# Patient Record
Sex: Female | Born: 2000
Health system: Southern US, Community
[De-identification: ages and names within clinical notes are randomized; demographics above are authoritative.]

## PROBLEM LIST (undated history)

## (undated) DIAGNOSIS — M419 Scoliosis, unspecified: Secondary | ICD-10-CM

## (undated) DIAGNOSIS — Q8500 Neurofibromatosis, unspecified: Secondary | ICD-10-CM

## (undated) HISTORY — DX: Neurofibromatosis, unspecified: Q85.00

## (undated) HISTORY — PX: BACK SURGERY: SHX140

---
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
## 2018-08-25 DIAGNOSIS — Z03818 Encounter for observation for suspected exposure to other biological agents ruled out: Secondary | ICD-10-CM | POA: Diagnosis not present

## 2018-09-01 ENCOUNTER — Telehealth: Payer: Self-pay | Admitting: Cardiology

## 2018-09-01 NOTE — Telephone Encounter (Signed)
Virtual Visit Pre-Appointment Phone Call  "(Name), I am calling you today to discuss your upcoming appointment. We are currently trying to limit exposure to the virus that causes COVID-19 by seeing patients at home rather than in the office."  1. "What is the BEST phone number to call the day of the visit?" - include this in appointment notes  2. Do you have or have access to (through a family member/friend) a smartphone with video capability that we can use for your visit?" a. If yes - list this number in appt notes as cell (if different from BEST phone #) and list the appointment type as a VIDEO visit in appointment notes b. If no - list the appointment type as a PHONE visit in appointment notes  3. Confirm consent - "In the setting of the current Covid19 crisis, you are scheduled for a (phone or video) visit with your provider on (date) at (time).  Just as we do with many in-office visits, in order for you to participate in this visit, we must obtain consent.  If you'd like, I can send this to your mychart (if signed up) or email for you to review.  Otherwise, I can obtain your verbal consent now.  All virtual visits are billed to your insurance company just like a normal visit would be.  By agreeing to a virtual visit, we'd like you to understand that the technology does not allow for your provider to perform an examination, and thus may limit your provider's ability to fully assess your condition. If your provider identifies any concerns that need to be evaluated in person, we will make arrangements to do so.  Finally, though the technology is pretty good, we cannot assure that it will always work on either your or our end, and in the setting of a video visit, we may have to convert it to a phone-only visit.  In either situation, we cannot ensure that we have a secure connection.  Are you willing to proceed?" STAFF: Did the patient verbally acknowledge consent to telehealth visit? Document  YES/NO here: Yes  4. Advise patient to be prepared - "Two hours prior to your appointment, go ahead and check your blood pressure, pulse, oxygen saturation, and your weight (if you have the equipment to check those) and write them all down. When your visit starts, your provider will ask you for this information. If you have an Apple Watch or Kardia device, please plan to have heart rate information ready on the day of your appointment. Please have a pen and paper handy nearby the day of the visit as well."  5. Give patient instructions for MyChart download to smartphone OR Doximity/Doxy.me as below if video visit (depending on what platform provider is using)  6. Inform patient they will receive a phone call 15 minutes prior to their appointment time (may be from unknown caller ID) so they should be prepared to answer    TELEPHONE CALL NOTE  Toni Caldwell has been deemed a candidate for a follow-up tele-health visit to limit community exposure during the Covid-19 pandemic. I spoke with the patient via phone to ensure availability of phone/video source, confirm preferred email & phone number, and discuss instructions and expectations.  I reminded Toni Caldwell to be prepared with any vital sign and/or heart rhythm information that could potentially be obtained via home monitoring, at the time of her visit. I reminded Toni Caldwell to expect a phone call prior to  her visit.  Calla Kicks 09/01/2018 4:13 PM   INSTRUCTIONS FOR DOWNLOADING THE MYCHART APP TO SMARTPHONE  - The patient must first make sure to have activated MyChart and know their login information - If Apple, go to [employer] and type in MyChart in the search bar and download the app. If Android, ask patient to go to [employer] and type in [place] in the search bar and download the app. The app is free but as with any other app downloads, their phone may require them to verify saved payment information or  Apple/Android password.  - The patient will need to then log into the app with their MyChart username and password, and select [facility] as their healthcare provider to link the account. When it is time for your visit, go to the MyChart app, find appointments, and click Begin Video Visit. Be sure to Select Allow for your device to access the Microphone and Camera for your visit. You will then be connected, and your provider will be with you shortly.  **If they have any issues connecting, or need assistance please contact MyChart service desk [phone])**  **If using a computer, in order to ensure the best quality for their visit they will need to use either of the following Internet Browsers: [employer], or Google Chrome**  IF USING DOXIMITY or DOXY.ME - The patient will receive a link just prior to their visit by text.     FULL LENGTH CONSENT FOR TELE-HEALTH VISIT   I hereby voluntarily request, consent and authorize [facility] and its employed or contracted physicians, physician assistants, nurse practitioners or other licensed health care professionals (the Practitioner), to provide me with telemedicine health care services (the Services") as deemed necessary by the treating Practitioner. I acknowledge and consent to receive the Services by the Practitioner via telemedicine. I understand that the telemedicine visit will involve communicating with the Practitioner through live audiovisual communication technology and the disclosure of certain medical information by electronic transmission. I acknowledge that I have been given the opportunity to request an in-person assessment or other available alternative prior to the telemedicine visit and am voluntarily participating in the telemedicine visit.  I understand that I have the right to withhold or withdraw my consent to the use of telemedicine in the course of my care at any time, without affecting my right to future care  or treatment, and that the Practitioner or I may terminate the telemedicine visit at any time. I understand that I have the right to inspect all information obtained and/or recorded in the course of the telemedicine visit and may receive copies of available information for a reasonable fee.  I understand that some of the potential risks of receiving the Services via telemedicine include:   Delay or interruption in medical evaluation due to technological equipment failure or disruption;  Information transmitted may not be sufficient (e.g. poor resolution of images) to allow for appropriate medical decision making by the Practitioner; and/or   In rare instances, security protocols could fail, causing a breach of personal health information.  Furthermore, I acknowledge that it is my responsibility to provide information about my medical history, conditions and care that is complete and accurate to the best of my ability. I acknowledge that Practitioner's advice, recommendations, and/or decision may be based on factors not within their control, such as incomplete or inaccurate data provided by me or distortions of diagnostic images or specimens that may result from electronic transmissions. I  understand that the practice of medicine is not an exact science and that Practitioner makes no warranties or guarantees regarding treatment outcomes. I acknowledge that I will receive a copy of this consent concurrently upon execution via email to the email address I last provided but may also request a printed copy by calling the office of [facility].    I understand that my insurance will be billed for this visit.   I have read or had this consent read to me.  I understand the contents of this consent, which adequately explains the benefits and risks of the Services being provided via telemedicine.   I have been provided ample opportunity to ask questions regarding this consent and the Services and have had  my questions answered to my satisfaction.  I give my informed consent for the services to be provided through the use of telemedicine in my medical care  By participating in this telemedicine visit I agree to the above.

## 2018-09-07 ENCOUNTER — Other Ambulatory Visit: Payer: Self-pay

## 2018-09-07 ENCOUNTER — Encounter: Payer: Self-pay | Admitting: Cardiology

## 2018-09-07 ENCOUNTER — Telehealth (INDEPENDENT_AMBULATORY_CARE_PROVIDER_SITE_OTHER): Payer: 59 | Admitting: Cardiology

## 2018-09-07 VITALS — BP 104/64 | HR 98 | Temp 98.4°F | Ht 60.0 in | Wt 112.0 lb

## 2018-09-07 DIAGNOSIS — Q231 Congenital insufficiency of aortic valve: Secondary | ICD-10-CM | POA: Diagnosis not present

## 2018-09-07 DIAGNOSIS — R002 Palpitations: Secondary | ICD-10-CM | POA: Insufficient documentation

## 2018-09-07 DIAGNOSIS — I341 Nonrheumatic mitral (valve) prolapse: Secondary | ICD-10-CM

## 2018-09-07 DIAGNOSIS — R011 Cardiac murmur, unspecified: Secondary | ICD-10-CM | POA: Insufficient documentation

## 2018-09-07 NOTE — Progress Notes (Signed)
Virtual Visit via Video Note   This visit type was conducted due to national recommendations for restrictions regarding the COVID-19 Pandemic (e.g. social distancing) in an effort to limit this patient's exposure and mitigate transmission in our community.  Due to her co-morbid illnesses, this patient is at least at moderate risk for complications without adequate follow up.  This format is felt to be most appropriate for this patient at this time.  All issues noted in this document were discussed and addressed.  A limited physical exam was performed with this format.  Please refer to the patient's chart for her consent to telehealth for [facility].   Date:  09/07/2018   ID:  Toni Caldwell, DOB 2000-11-21, MRN [id number]  Patient Location: Home Provider Location: Office  PCP:  Rochel Brome, MD  Cardiologist:  Shirlee More, MD  Electrophysiologist:  None   Evaluation Performed:  Follow-Up Visit  Chief Complaint:  Annual follow up of biscuspid aortic valve, mild mitral valve prolapse, and palpitations.   History of Present Illness:    Toni Caldwell is a 18 y.o. female with bicuspid aortic valve, mild mitral valve prolapse with mild to moderate regurgitation on echo 09/19/17 associated with murmur, palpitations, and scoliosis. In May 2019 she had workup for palpitations and tachycardia with 48 hour Holter showing SR with rare PAC's/PVC's and sinus arrhythmia. Due to her abnormal echocardiogram she was prescribed prophylactic antibiotics for dental procedures.   The patient does not have symptoms concerning for COVID-19 infection (fever, chills, cough, or new shortness of breath).   Overall she has done well but still has intermittent episodes where her heart seems rapid and lightheaded.  The rhythms are not sustained severe we discussed the potential of the iPhone adapter to record heart rhythm but do not feel is necessary at this time I did ask her to be sure she drinks adequate  liquids and to consider adding salt to her diet.  She follows endocarditis prophylaxis because of her spine surgery has had no chest pain shortness of breath or syncope.  We discussed that I would likely wait at least 1 more year repeat before repeating an echocardiogram and she has no restrictions to activities and will continue endocarditis prophylaxis Past Medical History:  Diagnosis Date  . Scoliosis    Past Surgical History:  Procedure Laterality Date  . BACK SURGERY       No outpatient medications have been marked as taking for the 09/07/18 encounter (Appointment) with Richardo Priest, MD.     Allergies:   Codeine   Social History   Tobacco Use  . Smoking status: Never Smoker  . Smokeless tobacco: Never Used  Substance Use Topics  . Alcohol use: No  . Drug use: No     Family Hx: The patient's family history includes Healthy in her mother; Other in her father.  ROS:   Please see the history of present illness.     All other systems reviewed and are negative.   Prior CV studies:   The following studies were reviewed today:  Echo performed 09/19/2017: EF 60-65%, bicuspid AV, mild MV prolapse, mild to moderate MV  Regurgitation.   48h Holter monitor 06/2017: "predominantly sinus rhythm with sinus arrhhythmia and race PAC's/PVC's. No significant arrhythmia"  Labs/Other Tests and Data Reviewed:    EKG:  No ECG reviewed.  Recent Labs: No results found for requested labs within last 8760 hours.   Recent Lipid Panel No results found for: CHOL,  TRIG, HDL, CHOLHDL, LDLCALC, LDLDIRECT  Wt Readings from Last 3 Encounters:  09/30/17 111 lb (50.3 kg) (18 %, Z= -0.93)*  08/30/17 111 lb (50.3 kg) (18 %, Z= -0.92)*  07/19/17 111 lb 3.2 oz (50.4 kg) (19 %, Z= -0.89)*   * Growth percentiles are based on CDC (Girls, 2-20 Years) data.     Objective:    Vital Signs:  LMP 09/27/2017    VITAL SIGNS:  reviewed GEN:  no acute distress EYES:  sclerae anicteric, EOMI -  Extraocular Movements Intact RESPIRATORY:  normal respiratory effort, symmetric expansion CARDIOVASCULAR:  no peripheral edema SKIN:  no rash, lesions or ulcers. MUSCULOSKELETAL:  no obvious deformities. NEURO:  alert and oriented x 3, no obvious focal deficit PSYCH:  normal affect  ASSESSMENT & PLAN:    1. Bicuspid aortic valve -stable recheck echocardiogram 1 year and continue endocarditis prophylaxis 2. Mitral valve prolapse with mild to moderate regurgitations -recheck echocardiogram 1 year 3. Palpitations -persistent symptoms in the past correlated with sinus arrhythmia I do not think we need to repeat a arrhythmia evaluation at this time and if worsened would benefit from recording heart rhythm with the iPhone adapter.  COVID-19 Education: The signs and symptoms of COVID-19 were discussed with the patient and how to seek care for testing (follow up with PCP or arrange E-visit).  The importance of social distancing was discussed today.  Time:   Today, I have spent 15 minutes with the patient with telehealth technology discussing the above problems.     Medication Adjustments/Labs and Tests Ordered: Current medicines are reviewed at length with the patient today.  Concerns regarding medicines are outlined above.   Tests Ordered: No orders of the defined types were placed in this encounter.   Medication Changes: No orders of the defined types were placed in this encounter.   Disposition:  Follow up 1 year  Signed, Shirlee More, MD  09/07/2018 9:30 AM    [place]

## 2018-09-07 NOTE — Patient Instructions (Signed)
Medication Instructions:  Your physician recommends that you continue on your current medications as directed. Please refer to the Current Medication list given to you today.   If you need a refill on your cardiac medications before your next appointment, please call your pharmacy.   Lab work: None If you have labs (blood work) drawn today and your tests are completely normal, you will receive your results only by: [facility] MyChart Message (if you have MyChart) OR . A paper copy in the mail If you have any lab test that is abnormal or we need to change your treatment, we will call you to review the results.  Testing/Procedures: Your physician has requested that you have an echocardiogram. Echocardiography is a painless test that uses sound waves to create images of your heart. It provides your doctor with information about the size and shape of your heart and how well your heart's chambers and valves are working. This procedure takes approximately one hour. There are no restrictions for this procedure.  Your echo will be scheduled closer to your  1 year appointment.  Follow-Up: At [facility], you and your health needs are our priority.  As part of our continuing mission to provide you with exceptional heart care, we have created designated Provider Care Teams.  These Care Teams include your primary Cardiologist (physician) and Advanced Practice Providers (APPs -  Physician Assistants and Nurse Practitioners) who all work together to provide you with the care you need, when you need it. You will need a follow up appointment in 1 years.  Please call our office 2 months in advance to schedule this appointment.   Any Other Special Instructions Will Be Listed Below (If Applicable).   Echocardiogram An echocardiogram is a procedure that uses painless sound waves (ultrasound) to produce an image of the heart. Images from an echocardiogram can provide important information about:  Signs of coronary  artery disease (CAD).  Aneurysm detection. An aneurysm is a weak or damaged part of an artery wall that bulges out from the normal force of blood pumping through the body.  Heart size and shape. Changes in the size or shape of the heart can be associated with certain conditions, including heart failure, aneurysm, and CAD.  Heart muscle function.  Heart valve function.  Signs of a past heart attack.  Fluid buildup around the heart.  Thickening of the heart muscle.  A tumor or infectious growth around the heart valves. Tell a health care provider about:  Any allergies you have.  All medicines you are taking, including vitamins, herbs, eye drops, creams, and over-the-counter medicines.  Any blood disorders you have.  Any surgeries you have had.  Any medical conditions you have.  Whether you are pregnant or may be pregnant. What are the risks? Generally, this is a safe procedure. However, problems may occur, including:  Allergic reaction to dye (contrast) that may be used during the procedure. What happens before the procedure? No specific preparation is needed. You may eat and drink normally. What happens during the procedure?   An IV tube may be inserted into one of your veins.  You may receive contrast through this tube. A contrast is an injection that improves the quality of the pictures from your heart.  A gel will be applied to your chest.  A wand-like tool (transducer) will be moved over your chest. The gel will help to transmit the sound waves from the transducer.  The sound waves will harmlessly bounce off of  your heart to allow the heart images to be captured in real-time motion. The images will be recorded on a computer. The procedure may vary among health care providers and hospitals. What happens after the procedure?  You may return to your normal, everyday life, including diet, activities, and medicines, unless your health care provider tells you not to do  that. Summary  An echocardiogram is a procedure that uses painless sound waves (ultrasound) to produce an image of the heart.  Images from an echocardiogram can provide important information about the size and shape of your heart, heart muscle function, heart valve function, and fluid buildup around your heart.  You do not need to do anything to prepare before this procedure. You may eat and drink normally.  After the echocardiogram is completed, you may return to your normal, everyday life, unless your health care provider tells you not to do that. This information is not intended to replace advice given to you by your health care provider. Make sure you discuss any questions you have with your health care provider. Document Released: 04/02/2000 Document Revised: 05/08/2016 Document Reviewed: 05/08/2016 Elsevier Interactive Patient Education  2019 [employer].

## 2019-03-23 ENCOUNTER — Telehealth: Payer: Self-pay | Admitting: Cardiology

## 2019-03-23 ENCOUNTER — Other Ambulatory Visit: Payer: Self-pay | Admitting: Cardiology

## 2019-03-23 DIAGNOSIS — Q231 Congenital insufficiency of aortic valve: Secondary | ICD-10-CM

## 2019-03-23 MED ORDER — AMOXICILLIN 500 MG PO CAPS: 2000.0000 mg | ORAL_CAPSULE | Freq: Every day | ORAL | 1 refills | Status: DC | PRN

## 2019-03-23 MED FILL — AMOXICILLIN 500 MG CAPSULE: 500 | 5 days supply | Qty: 20 | Fill #0

## 2019-03-23 NOTE — Telephone Encounter (Signed)
Please advise if amoxicillin is needed prior to dental procedures and if so what dosage should be sent in for patient. Thanks!

## 2019-03-23 NOTE — Telephone Encounter (Signed)
Yes amoxicillin 2 g before dental I will send the prescription

## 2019-03-23 NOTE — Telephone Encounter (Signed)
Patient has upcomg dental appt next week and needs her antibiotic called to the [facility] on [address] in [place].

## 2019-07-10 IMAGING — CT CT CHEST W/O CM
2 of 4 series · 15 of 36 positions shown, 18 images · non-contrast
Comparison: None.

CLINICAL DATA: Abnormal echocardiogram.  Evaluate aortic valve.

EXAM:
CT CHEST WITHOUT CONTRAST
TECHNIQUE: Multidetector CT imaging of the chest was performed following the
standard protocol without IV contrast.

[Series 2: thorax · axial · 0.64mm/px · z∈[-278,-16]mm · 12 of 147 slices shown, 15 images]
[im 8/147  mediastinal]
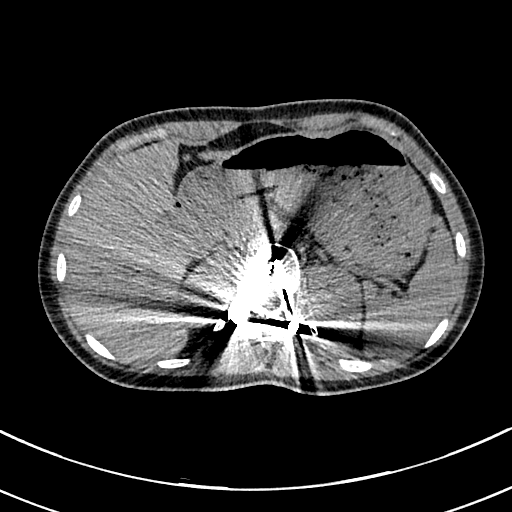
[im 8/147  lung]
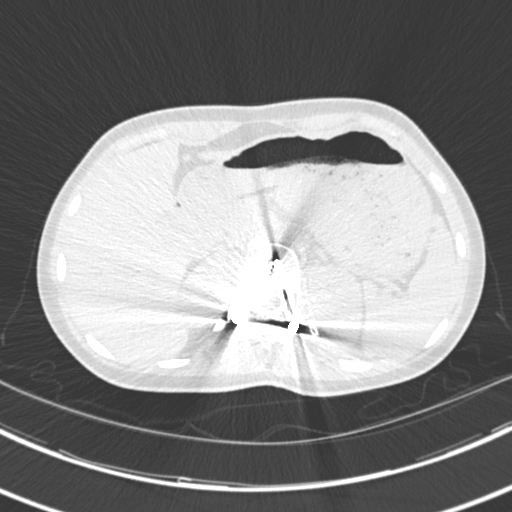
[im 22/147  lung]
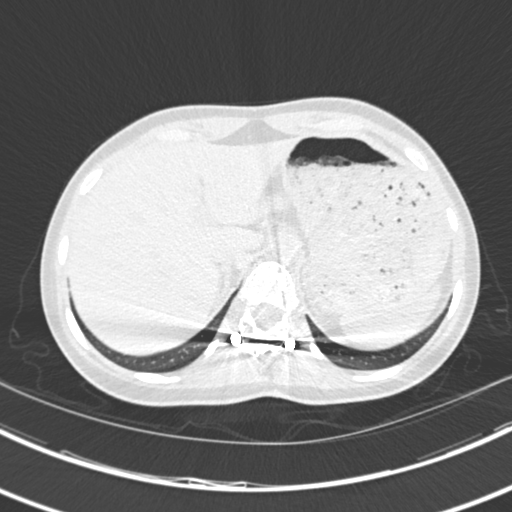
[im 30/147  lung]
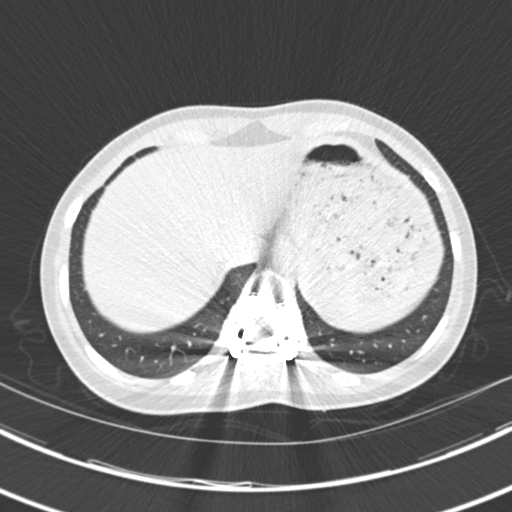
[im 44/147  lung]
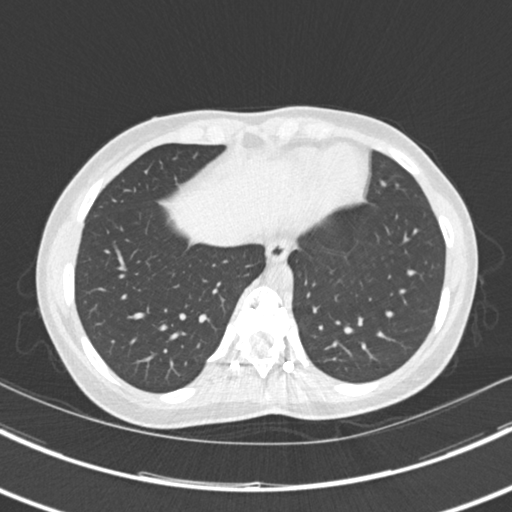
[im 59/147  mediastinal]
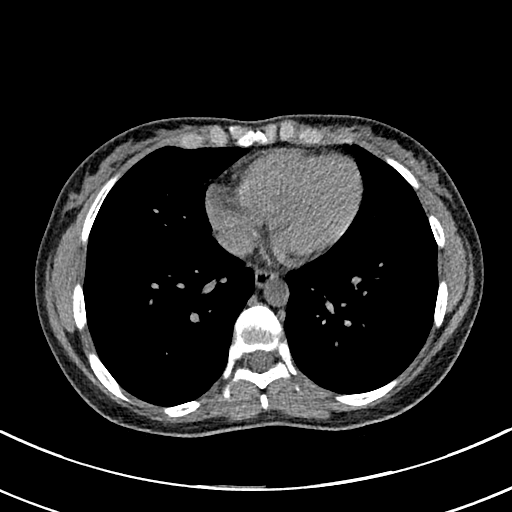
[im 59/147  lung]
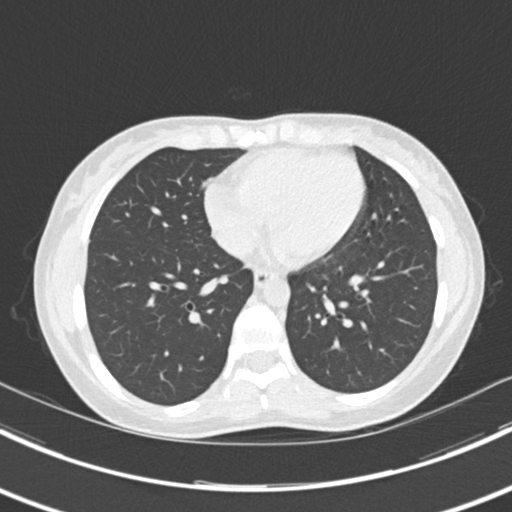
[im 66/147  lung]
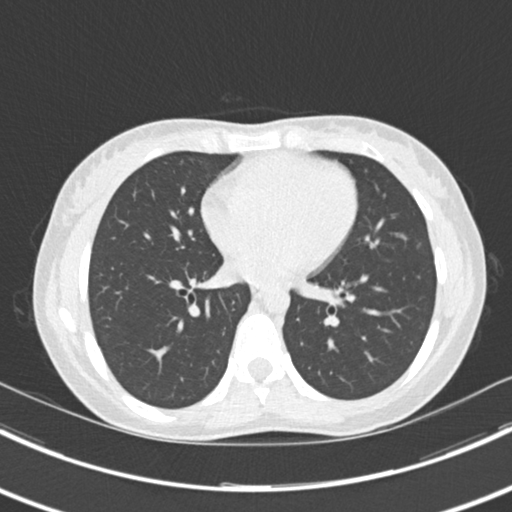
[im 81/147  lung]
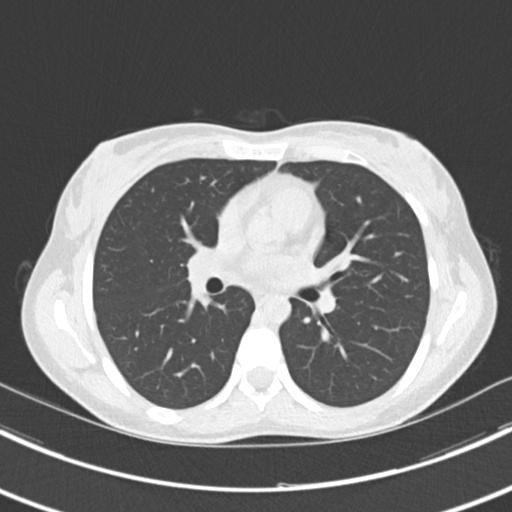
[im 88/147  lung]
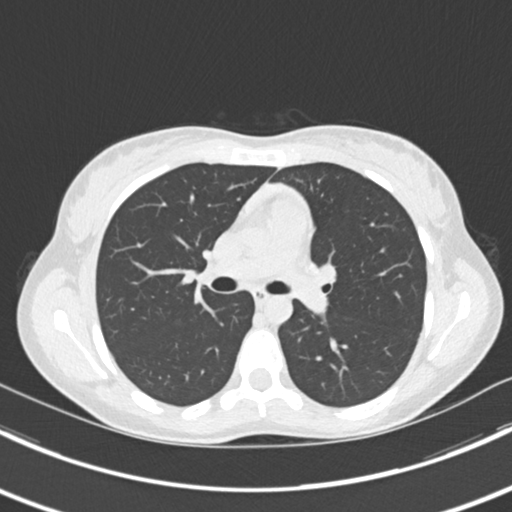
[im 103/147  mediastinal]
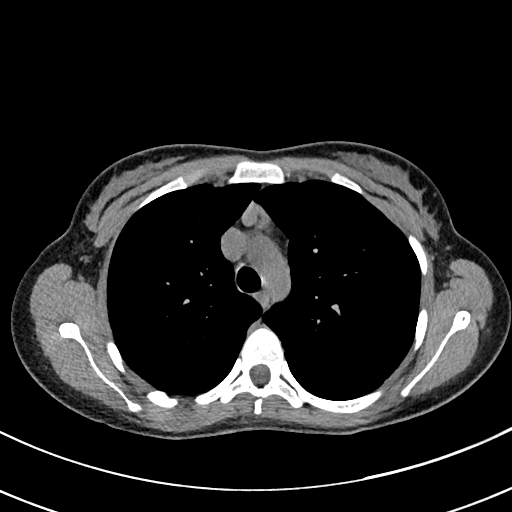
[im 103/147  lung]
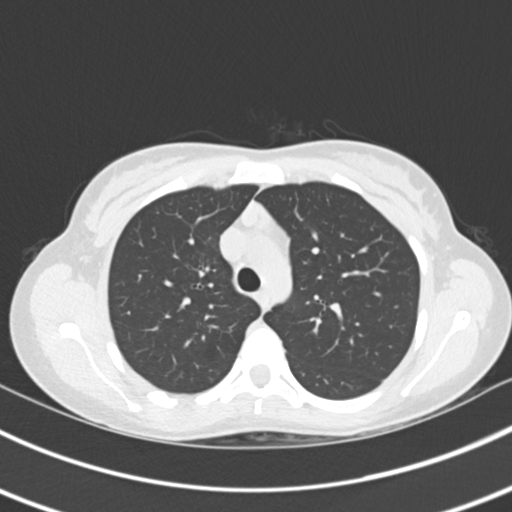
[im 117/147  lung]
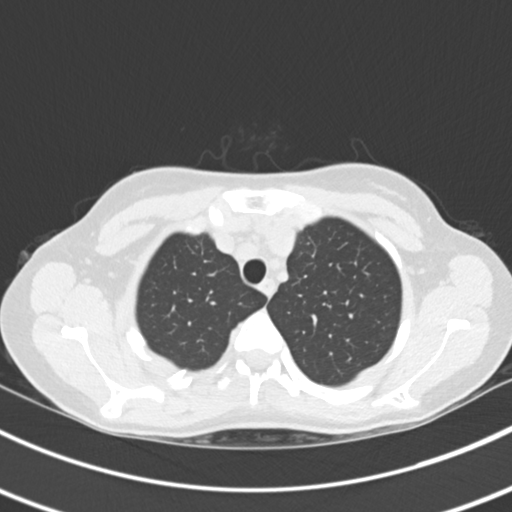
[im 125/147  lung]
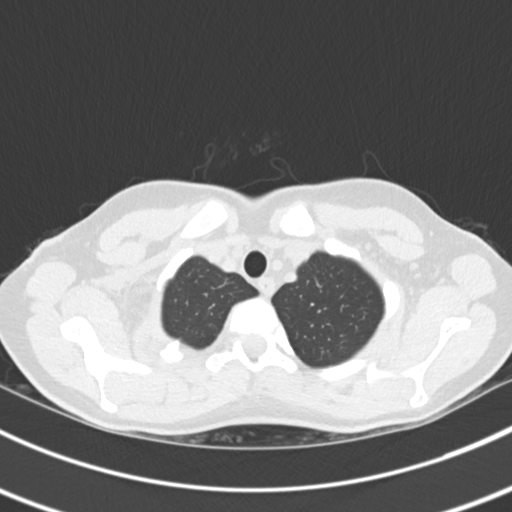
[im 139/147  lung]
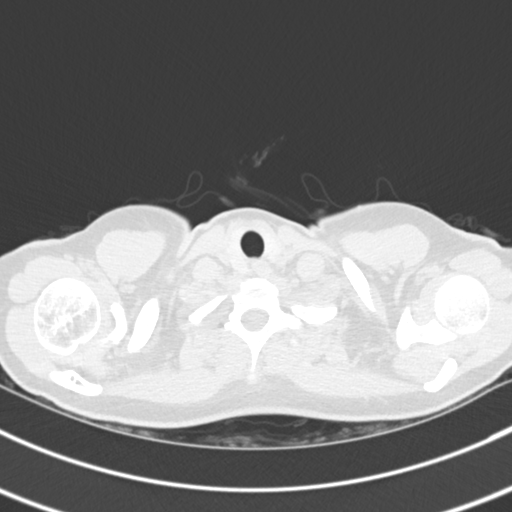

[Series 5: coronal · coronal · 0.61mm/px · 3 of 115 slices shown]
[im 23/115  lung]
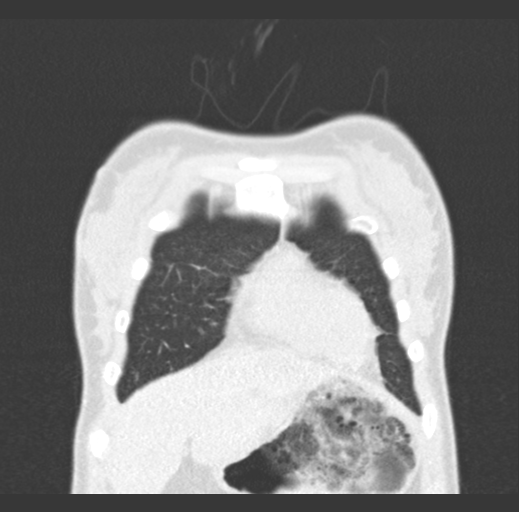
[im 46/115  lung]
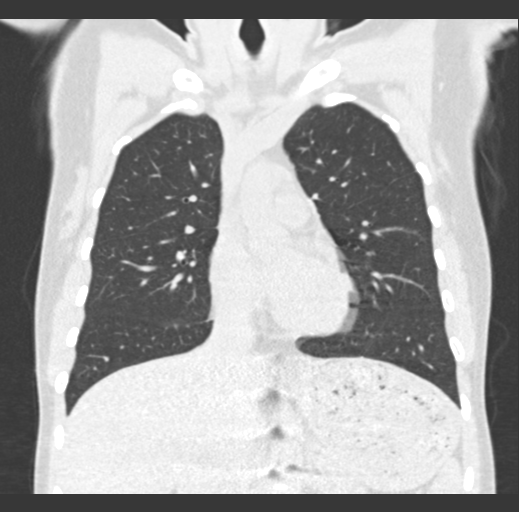
[im 69/115  lung]
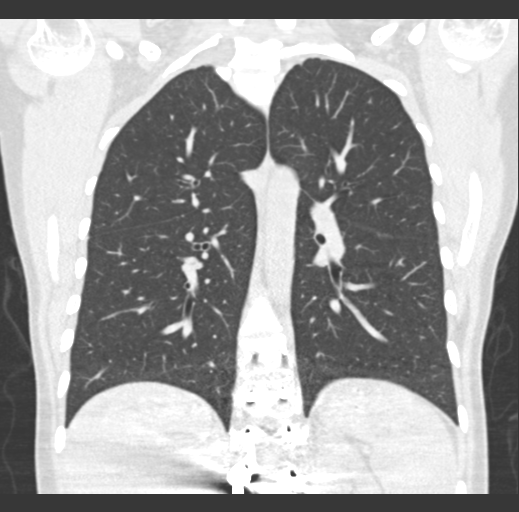

[15 of 36 positions shown; findings below may reference images not displayed]

FINDINGS: Cardiovascular: No abnormality at the aortic root on noncontrast
exam. The ascending aorta is normal caliber at 19 mm. Descending
aorta is normal caliber at 17 mm. Great vessels are normal. No
valvular calcification. No pericardial fluid.

Mediastinum/Nodes: Normal volume thymus in the anterior mediastinum.
Esophagus normal. Trachea normal. No lymphadenopathy thyroid gland
normal.

Lungs/Pleura: Normal lung parenchyma

Upper Abdomen: Limited view of the liver, kidneys, pancreas are
unremarkable. Normal adrenal glands.

Musculoskeletal: Fusion rods for scoliosis noted
IMPRESSION: 1. No abnormality aorta identified on noncontrast exam.
2. Normal CT of thorax otherwise.

## 2020-02-08 DIAGNOSIS — I10 Essential (primary) hypertension: Secondary | ICD-10-CM | POA: Diagnosis not present

## 2020-02-08 DIAGNOSIS — R000 Tachycardia, unspecified: Secondary | ICD-10-CM | POA: Diagnosis not present

## 2020-02-08 DIAGNOSIS — M419 Scoliosis, unspecified: Secondary | ICD-10-CM | POA: Diagnosis not present

## 2020-02-08 DIAGNOSIS — Q231 Congenital insufficiency of aortic valve: Secondary | ICD-10-CM | POA: Diagnosis not present

## 2020-02-08 DIAGNOSIS — Q8501 Neurofibromatosis, type 1: Secondary | ICD-10-CM | POA: Diagnosis not present

## 2020-02-14 DIAGNOSIS — I341 Nonrheumatic mitral (valve) prolapse: Secondary | ICD-10-CM | POA: Diagnosis not present

## 2020-02-14 DIAGNOSIS — I340 Nonrheumatic mitral (valve) insufficiency: Secondary | ICD-10-CM | POA: Diagnosis not present

## 2020-04-10 ENCOUNTER — Other Ambulatory Visit: Payer: 59

## 2020-05-17 DIAGNOSIS — Z23 Encounter for immunization: Secondary | ICD-10-CM | POA: Diagnosis not present

## 2020-07-09 ENCOUNTER — Other Ambulatory Visit (HOSPITAL_BASED_OUTPATIENT_CLINIC_OR_DEPARTMENT_OTHER): Payer: Self-pay

## 2020-07-09 MED ORDER — ATENOLOL 25 MG PO TABS
12.5000 mg | ORAL_TABLET | Freq: Every day | ORAL | 3 refills | Status: DC
  Filled 2020-07-09: qty 45, 90d supply, fill #0
  Filled 2020-10-17: qty 45, 90d supply, fill #1

## 2020-08-13 ENCOUNTER — Encounter (HOSPITAL_BASED_OUTPATIENT_CLINIC_OR_DEPARTMENT_OTHER): Payer: Self-pay | Admitting: Family Medicine

## 2020-08-13 ENCOUNTER — Other Ambulatory Visit: Payer: Self-pay

## 2020-08-13 ENCOUNTER — Ambulatory Visit (INDEPENDENT_AMBULATORY_CARE_PROVIDER_SITE_OTHER): Payer: 59 | Admitting: Family Medicine

## 2020-08-13 VITALS — BP 100/70 | HR 76 | Ht 60.0 in | Wt 116.8 lb

## 2020-08-13 DIAGNOSIS — R000 Tachycardia, unspecified: Secondary | ICD-10-CM | POA: Diagnosis not present

## 2020-08-13 DIAGNOSIS — G8929 Other chronic pain: Secondary | ICD-10-CM | POA: Diagnosis not present

## 2020-08-13 DIAGNOSIS — M549 Dorsalgia, unspecified: Secondary | ICD-10-CM | POA: Insufficient documentation

## 2020-08-13 DIAGNOSIS — M545 Low back pain, unspecified: Secondary | ICD-10-CM | POA: Diagnosis not present

## 2020-08-13 DIAGNOSIS — Q8501 Neurofibromatosis, type 1: Secondary | ICD-10-CM | POA: Diagnosis not present

## 2020-08-13 NOTE — Progress Notes (Signed)
New Patient Office Visit  Subjective:  Patient ID: Toni Caldwell, female    DOB: Jan 12, 2001  Age: 20 y.o. MRN: [id number]  CC:  Chief Complaint  Patient presents with  . Establish Care  . Back Pain    Patient had a spinal fusion 8 years ago and has had relatively no pain until 6 months ago. Patient is having lower back pain that doesn't radiate.    HPI BRAYLYNN Caldwell is a 20 year old female presenting to establish in clinic.  She has current concerns today related to low back pain.  Past medical history significant for neurofibromatosis type I, episodic tachycardia, scoliosis with spinal fusion in the past.  Low back pain: Started about 6 months ago.  Reports that symptoms have been about the same over this timeframe.  History of scoliosis with spinal fusion of T10-L4 completed in 2014.  She denies any radiation of pain into lower extremities, denies any numbness or tingling in lower extremities.  Has been using ibuprofen as needed to help with symptoms as well as other OTC measures.  Does some regular light aerobic activity, no significant impact on this as a result of the back pain.  Neurofibromatosis type I: Follows at least annually with hematology/oncology related to this.  Episodic tachycardia: Has been evaluated in the past.  Work-up regarding pheochromocytoma was negative.  Evaluate with cardiology and was started on low-dose of atenolol which has helped control symptoms.  She denies any current issues with this.  She is currently in school at [employer], studies are focused on biotechnology.  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  Past Medical History:  Diagnosis Date  . Neurofibromatosis ([place])   . Scoliosis     Past Surgical History:  Procedure Laterality Date  . BACK SURGERY      Family History  Problem Relation Age of Onset  . Healthy Mother   . Other Father        Tachycardia  . Asthma Paternal Grandmother   . Kidney failure  Paternal Grandmother     Social History   Socioeconomic History  . Marital status: Single    Spouse name: Not on file  . Number of children: Not on file  . Years of education: Not on file  . Highest education level: Not on file  Occupational History  . Not on file  Tobacco Use  . Smoking status: Never Smoker  . Smokeless tobacco: Never Used  Vaping Use  . Vaping Use: Never used  Substance and Sexual Activity  . Alcohol use: Yes    Alcohol/week: 1.0 - 2.0 standard drink    Types: 1 - 2 Standard drinks or equivalent per week  . Drug use: No  . Sexual activity: Not Currently    Birth control/protection: Abstinence  Other Topics Concern  . Not on file  Social History Narrative  . Not on file   Social Determinants of Health   Financial Resource Strain: Not on file  Food Insecurity: Not on file  Transportation Needs: Not on file  Physical Activity: Not on file  Stress: Not on file  Social Connections: Not on file  Intimate Partner Violence: Not on file    Objective:   Today's Vitals: BP 100/70   Pulse 76   Ht 5' (1.524 m)   Wt 116 lb 12.8 oz (53 kg)   LMP 09/27/2017   SpO2 99%   BMI 22.81 kg/m   Physical Exam  Pleasant 20 year old  female in no acute distress Cardiovascular exam with regular rate and rhythm, no murmurs appreciated Lungs clear to auscultation bilaterally Lumbar spine exam: No tenderness to palpation through spinous processes or bilateral paraspinal muscles.  Negative straight leg raise bilaterally.  Normal distal reflexes.  Distal neurovascular exam intact.  Assessment & Plan:   Problem List Items Addressed This Visit      Nervous and Auditory   Neurofibromatosis, type 1 ([place])    Follows with hematology/oncology in regards to monitoring Recommendations include monitoring blood pressure, monitoring for transformation of neurofibromas, ophthalmology evaluations at least every 1 to 2 years, beginning with mammograms starting at the age of 42         Other   Sinus tachycardia - Primary    Chronic, controlled with atenolol Continue to monitor at future visits Continue with atenolol      Back pain    Chronic, pain not at goal Can continue with conservative measures including OTC medications to help with pain control Will also refer to physical therapy for further evaluation and treatment Plan for follow-up in about 8 weeks to monitor progress If not improving as expected, likely proceed with imaging      Relevant Orders   Ambulatory referral to Physical Therapy      Outpatient Encounter Medications as of 08/13/2020  Medication Sig  . amoxicillin (AMOXIL) 500 MG capsule Take 4 capsules (2,000 mg total) by mouth daily as needed. Take  Prior to dental procedures  . atenolol (TENORMIN) 25 MG tablet Take 1/2 tablet (12.5 mg total) by mouth daily.   No facility-administered encounter medications on file as of 08/13/2020.   Spent 45 minutes on this patient encounter, including preparation, chart review, face-to-face counseling with patient and coordination of care, and documentation of encounter  Follow-up: Return in about 2 weeks (around 08/27/2020).   Toni Dehaan J De [place], MD

## 2020-08-13 NOTE — Patient Instructions (Signed)
  Medication Instructions:  Your physician recommends that you continue on your current medications as directed. Please refer to the Current Medication list given to you today. --If you need a refill on any your medications before your next appointment, please call your pharmacy first. If no refills are authorized on file call the office.--  Referrals/Procedures/Imaging: A referral has been placed for you to Physical Therapy. A list will be provided for you, please choose which location you would like to have your referral sent to and let the office know.   Follow-Up: Your next appointment:   Your physician recommends that you schedule a follow-up appointment in: 2 MONTHS with Dr. de [place]  Thanks for letting us be apart of your health journey!!  Primary Care and Sports Medicine   Dr. de [place] and Worthy Keeler, DNP, AGNP  We recommend signing up for the patient portal called "MyChart".  Sign up information is provided on this After Visit Summary.  MyChart is used to connect with patients for Virtual Visits (Telemedicine).  Patients are able to view lab/test results, encounter notes, upcoming appointments, etc.  Non-urgent messages can be sent to your provider as well.   To learn more about what you can do with MyChart, please visit --  [email or web address].

## 2020-08-13 NOTE — Assessment & Plan Note (Signed)
Follows with hematology/oncology in regards to monitoring Recommendations include monitoring blood pressure, monitoring for transformation of neurofibromas, ophthalmology evaluations at least every 1 to 2 years, beginning with mammograms starting at the age of 39

## 2020-08-13 NOTE — Assessment & Plan Note (Signed)
Chronic, pain not at goal Can continue with conservative measures including OTC medications to help with pain control Will also refer to physical therapy for further evaluation and treatment Plan for follow-up in about 8 weeks to monitor progress If not improving as expected, likely proceed with imaging

## 2020-08-13 NOTE — Assessment & Plan Note (Signed)
Chronic, controlled with atenolol Continue to monitor at future visits Continue with atenolol

## 2020-09-05 ENCOUNTER — Other Ambulatory Visit (HOSPITAL_BASED_OUTPATIENT_CLINIC_OR_DEPARTMENT_OTHER): Payer: Self-pay

## 2020-09-05 MED ORDER — ATENOLOL 25 MG PO TABS
12.5000 mg | ORAL_TABLET | Freq: Every day | ORAL | 3 refills | Status: DC
  Filled 2020-09-05: qty 45, 90d supply, fill #0

## 2020-10-16 ENCOUNTER — Encounter (HOSPITAL_BASED_OUTPATIENT_CLINIC_OR_DEPARTMENT_OTHER): Payer: Self-pay | Admitting: Family Medicine

## 2020-10-17 ENCOUNTER — Other Ambulatory Visit (HOSPITAL_BASED_OUTPATIENT_CLINIC_OR_DEPARTMENT_OTHER): Payer: Self-pay

## 2020-11-10 ENCOUNTER — Telehealth: Payer: 59 | Admitting: Family

## 2020-11-10 DIAGNOSIS — B9689 Other specified bacterial agents as the cause of diseases classified elsewhere: Secondary | ICD-10-CM

## 2020-11-10 DIAGNOSIS — J208 Acute bronchitis due to other specified organisms: Secondary | ICD-10-CM

## 2020-11-10 MED ORDER — AZITHROMYCIN 250 MG PO TABS: ORAL_TABLET | ORAL | 0 refills | Status: DC

## 2020-11-10 NOTE — Progress Notes (Signed)
We are sorry that you are not feeling well.  Here is how we plan to help!  Based on your presentation I believe you most likely have A cough due to bacteria.  When patients have a fever and a productive cough with a change in color or increased sputum production, we are concerned about bacterial bronchitis.  If left untreated it can progress to pneumonia.  If your symptoms do not improve with your treatment plan it is important that you contact your provider.   I have prescribed Azithromyin 250 mg: two tablets now and then one tablet daily for 4 additonal days    In addition you may use A non-prescription cough medication called Robitussin DAC. Take 2 teaspoons every 8 hours or Delsym: take 2 teaspoons every 12 hours., A non-prescription cough medication called Mucinex DM: take 2 tablets every 12 hours., and A prescription cough medication called Tessalon Perles '100mg'$ . You may take 1-2 capsules every 8 hours as needed for your cough.   If your heart increases again, you need to be seen in person for further work-up.    From your responses in the eVisit questionnaire you describe inflammation in the upper respiratory tract which is causing a significant cough.  This is commonly called Bronchitis and has four common causes:   Allergies Viral Infections Acid Reflux Bacterial Infection Allergies, viruses and acid reflux are treated by controlling symptoms or eliminating the cause. An example might be a cough caused by taking certain blood pressure medications. You stop the cough by changing the medication. Another example might be a cough caused by acid reflux. Controlling the reflux helps control the cough.  USE OF BRONCHODILATOR ("RESCUE") INHALERS: There is a risk from using your bronchodilator too frequently.  The risk is that over-reliance on a medication which only relaxes the muscles surrounding the breathing tubes can reduce the effectiveness of medications prescribed to reduce swelling and  congestion of the tubes themselves.  Although you feel brief relief from the bronchodilator inhaler, your asthma may actually be worsening with the tubes becoming more swollen and filled with mucus.  This can delay other crucial treatments, such as oral steroid medications. If you need to use a bronchodilator inhaler daily, several times per day, you should discuss this with your provider.  There are probably better treatments that could be used to keep your asthma under control.     HOME CARE Only take medications as instructed by your medical team. Complete the entire course of an antibiotic. Drink plenty of fluids and get plenty of rest. Avoid close contacts especially the very young and the elderly Cover your mouth if you cough or cough into your sleeve. Always remember to wash your hands A steam or ultrasonic humidifier can help congestion.   GET HELP RIGHT AWAY IF: You develop worsening fever. You become short of breath You cough up blood. Your symptoms persist after you have completed your treatment plan MAKE SURE YOU  Understand these instructions. Will watch your condition. Will get help right away if you are not doing well or get worse.    Thank you for choosing an e-visit.  Your e-visit answers were reviewed by a board certified advanced clinical practitioner to complete your personal care plan. Depending upon the condition, your plan could have included both over the counter or prescription medications.  Please review your pharmacy choice. Make sure the pharmacy is open so you can pick up prescription now. If there is a problem, you may contact  your provider through [employer] and have the prescription routed to another pharmacy.  Your safety is important to [place]. If you have drug allergies check your prescription carefully.   For the next 24 hours you can use MyChart to ask questions about today's visit, request a non-urgent call back, or ask for a work or school  excuse. You will get an email in the next two days asking about your experience. I hope that your e-visit has been valuable and will speed your recovery.  Approximately 5 minutes was spent documenting and reviewing patient's chart.

## 2020-12-01 ENCOUNTER — Ambulatory Visit (HOSPITAL_BASED_OUTPATIENT_CLINIC_OR_DEPARTMENT_OTHER): Payer: 59 | Admitting: Family Medicine

## 2021-01-20 ENCOUNTER — Other Ambulatory Visit (HOSPITAL_BASED_OUTPATIENT_CLINIC_OR_DEPARTMENT_OTHER): Payer: Self-pay

## 2021-01-20 MED ORDER — ATENOLOL 25 MG PO TABS
12.5000 mg | ORAL_TABLET | Freq: Every day | ORAL | 3 refills | Status: DC
  Filled 2021-01-20: qty 45, 90d supply, fill #0
  Filled 2021-04-16: qty 45, 90d supply, fill #1

## 2021-02-11 DIAGNOSIS — R000 Tachycardia, unspecified: Secondary | ICD-10-CM | POA: Diagnosis not present

## 2021-02-11 DIAGNOSIS — Q8501 Neurofibromatosis, type 1: Secondary | ICD-10-CM | POA: Diagnosis not present

## 2021-02-11 DIAGNOSIS — M726 Necrotizing fasciitis: Secondary | ICD-10-CM | POA: Diagnosis not present

## 2021-04-16 ENCOUNTER — Other Ambulatory Visit (HOSPITAL_BASED_OUTPATIENT_CLINIC_OR_DEPARTMENT_OTHER): Payer: Self-pay

## 2021-04-21 ENCOUNTER — Other Ambulatory Visit (HOSPITAL_BASED_OUTPATIENT_CLINIC_OR_DEPARTMENT_OTHER): Payer: Self-pay

## 2021-04-21 MED ORDER — ATENOLOL 25 MG PO TABS
12.5000 mg | ORAL_TABLET | Freq: Every day | ORAL | 3 refills | Status: DC
  Filled 2021-04-21 – 2021-07-30 (×2): qty 45, 90d supply, fill #0

## 2021-07-30 ENCOUNTER — Other Ambulatory Visit (HOSPITAL_BASED_OUTPATIENT_CLINIC_OR_DEPARTMENT_OTHER): Payer: Self-pay

## 2021-07-30 MED ORDER — ATENOLOL 25 MG PO TABS: 12.5000 mg | ORAL_TABLET | Freq: Every day | ORAL | 3 refills | Status: DC

## 2021-10-05 ENCOUNTER — Encounter: Payer: Self-pay | Admitting: Family Medicine

## 2021-10-05 ENCOUNTER — Ambulatory Visit: Payer: 59 | Admitting: Family Medicine

## 2021-10-05 VITALS — BP 104/64 | HR 66 | Temp 97.9°F | Ht 60.0 in | Wt 114.1 lb

## 2021-10-05 DIAGNOSIS — Z0000 Encounter for general adult medical examination without abnormal findings: Secondary | ICD-10-CM

## 2021-10-05 DIAGNOSIS — Z1159 Encounter for screening for other viral diseases: Secondary | ICD-10-CM

## 2021-10-05 DIAGNOSIS — Z114 Encounter for screening for human immunodeficiency virus [HIV]: Secondary | ICD-10-CM

## 2021-10-05 LAB — CBC
HCT: 42.6 % (ref 36.0–46.0)
Hemoglobin: 14.1 g/dL (ref 12.0–15.0)
MCHC: 33 g/dL (ref 30.0–36.0)
MCV: 89.2 fl (ref 78.0–100.0)
Platelets: 237 10*3/uL (ref 150.0–400.0)
RBC: 4.77 Mil/uL (ref 3.87–5.11)
RDW: 11.8 % (ref 11.5–15.5)
WBC: 8.3 10*3/uL (ref 4.0–10.5)

## 2021-10-05 LAB — TSH: TSH: 1.99 u[IU]/mL (ref 0.35–5.50)

## 2021-10-05 LAB — COMPREHENSIVE METABOLIC PANEL
ALT: 29 U/L (ref 0–35)
AST: 17 U/L (ref 0–37)
Albumin: 4.4 g/dL (ref 3.5–5.2)
Alkaline Phosphatase: 61 U/L (ref 39–117)
BUN: 10 mg/dL (ref 6–23)
CO2: 28 mEq/L (ref 19–32)
Calcium: 9.6 mg/dL (ref 8.4–10.5)
Chloride: 104 mEq/L (ref 96–112)
Creatinine, Ser: 0.68 mg/dL (ref 0.40–1.20)
GFR: 122.8 mL/min (ref >60.00)
Glucose, Bld: 82 mg/dL (ref 70–99)
Potassium: 4.3 mEq/L (ref 3.5–5.1)
Sodium: 137 mEq/L (ref 135–145)
Total Bilirubin: 0.6 mg/dL (ref 0.2–1.2)
Total Protein: 6.9 g/dL (ref 6.0–8.3)

## 2021-10-05 LAB — LIPID PANEL
Cholesterol: 147 mg/dL (ref 0–200)
HDL: 65.7 mg/dL (ref >39.00)
LDL Cholesterol: 73 mg/dL (ref 0–99)
NonHDL: 81.47
Total CHOL/HDL Ratio: 2
Triglycerides: 41 mg/dL (ref 0.0–149.0)
VLDL: 8.2 mg/dL (ref 0.0–40.0)

## 2021-10-05 LAB — T4, FREE: Free T4: 0.78 ng/dL (ref 0.60–1.60)

## 2021-10-05 MED ORDER — ATENOLOL 25 MG PO TABS: 12.5000 mg | ORAL_TABLET | Freq: Two times a day (BID) | ORAL | 3 refills | Status: DC

## 2021-10-05 NOTE — Progress Notes (Signed)
Chief Complaint  Patient presents with   New Patient (Initial Visit)     Well Woman Toni Caldwell is here for a complete physical.   Her last physical was >1 year ago.  Current diet: in general, a "healthy" diet. Current exercise: walking. Contraception? No Fatigue out of ordinary? Yes Seatbelt? Yes Advanced directive? No  Health Maintenance Pap/HPV- No Tetanus- Yes HIV screening- No Hep C screening- No  Past Medical History:  Diagnosis Date   Neurofibromatosis ([place])    NF1   Scoliosis      Past Surgical History:  Procedure Laterality Date   BACK SURGERY  2014   for scoliosis    Medications  Current Outpatient Medications on File Prior to Visit  Medication Sig Dispense Refill   atenolol (TENORMIN) 25 MG tablet Take 0.5 tablets (12.5 mg total) by mouth daily. 45 tablet 3   Allergies Allergies  Allergen Reactions   Codeine Other (See Comments)    Never had it, but several family members are allergic and prefers to not take it    Review of Systems: Constitutional:  no unexpected weight changes Eye:  no recent significant change in vision Ear/Nose/Mouth/Throat:  Ears:  no tinnitus or vertigo and no recent change in hearing Nose/Mouth/Throat:  no complaints of nasal congestion, no sore throat Cardiovascular: no chest pain Respiratory:  no cough and no shortness of breath Gastrointestinal:  no abdominal pain, no change in bowel habits GU:  Female: negative for dysuria or pelvic pain Musculoskeletal/Extremities:  no pain of the joints Integumentary (Skin/Breast):  no abnormal skin lesions reported Neurologic:  no headaches Endocrine:  denies fatigue Hematologic/Lymphatic:  No areas of easy bleeding  Exam BP 104/64   Pulse 66   Temp 97.9 F (36.6 C) (Oral)   Ht 5' (1.524 m)   Wt 114 lb 2 oz (51.8 kg)   LMP 09/27/2017   SpO2 99%   BMI 22.29 kg/m  General:  well developed, well nourished, in no apparent distress Skin:  no significant moles, warts, or  growths Head:  no masses, lesions, or tenderness Eyes:  pupils equal and round, sclera anicteric without injection Ears:  canals without lesions, TMs shiny without retraction, no obvious effusion, no erythema Nose:  nares patent, septum midline, mucosa normal, and no drainage or sinus tenderness Throat/Pharynx:  lips and gingiva without lesion; tongue and uvula midline; non-inflamed pharynx; no exudates or postnasal drainage Neck: neck supple without adenopathy, thyromegaly, or masses Lungs:  clear to auscultation, breath sounds equal bilaterally, no respiratory distress Cardio:  regular rate and rhythm, no bruits, no LE edema Abdomen:  abdomen soft, nontender; bowel sounds normal; no masses or organomegaly Genital: Defer to GYN Musculoskeletal:  symmetrical muscle groups noted without atrophy or deformity Extremities:  no clubbing, cyanosis, or edema, no deformities, no skin discoloration Neuro:  gait normal; deep tendon reflexes normal and symmetric Psych: well oriented with normal range of affect and appropriate judgment/insight  Assessment and Plan  Well adult exam - Plan: CBC, Comprehensive metabolic panel, Lipid panel, TSH, T4, free  Encounter for hepatitis C screening test for low risk patient - Plan: Hepatitis C antibody  Screening for HIV without presence of risk factors - Plan: HIV Antibody (routine testing w rflx)   Well 21 y.o. female. Counseled on diet and exercise. Other orders as above. Advanced directive form provided today.  Pediatrician's office didn't upload vaccine records to [facility], she reports having had the HPV vaccines. Will get [place] a copy.  Needs to  get pap smear, she is moving to [place] for school for the next year and promises to find a GYN up there to do the exam.  Follow up in 1 yr or prn. The patient voiced understanding and agreement to the plan.  [place], DO 10/05/21 1:14 PM

## 2021-10-05 NOTE — Patient Instructions (Addendum)
Give [place] 2-3 business days to get the results of your labs back.   Keep the diet clean and stay active.  Please get me a copy of your advanced directive form at your convenience.   Please find a gynecologist up [facility] for cervical cancer screening.   Send me your vaccine records when convenient.   Let us know if you need anything.

## 2021-10-06 LAB — HEPATITIS C ANTIBODY: Hepatitis C Ab: NONREACTIVE

## 2021-10-06 LAB — HIV ANTIBODY (ROUTINE TESTING W REFLEX): HIV 1&2 Ab, 4th Generation: NONREACTIVE

## 2022-04-14 DIAGNOSIS — Q8509 Other neurofibromatosis: Secondary | ICD-10-CM | POA: Diagnosis not present

## 2022-04-14 DIAGNOSIS — Q8501 Neurofibromatosis, type 1: Secondary | ICD-10-CM | POA: Diagnosis not present

## 2022-04-14 DIAGNOSIS — R000 Tachycardia, unspecified: Secondary | ICD-10-CM | POA: Diagnosis not present

## 2022-06-17 ENCOUNTER — Encounter: Payer: Commercial Managed Care - PPO | Admitting: Nurse Practitioner

## 2022-06-17 NOTE — Progress Notes (Signed)
[place] is out of state, messaged and canceled visit per policy

## 2022-11-15 ENCOUNTER — Other Ambulatory Visit (HOSPITAL_BASED_OUTPATIENT_CLINIC_OR_DEPARTMENT_OTHER): Payer: Self-pay

## 2022-11-16 ENCOUNTER — Other Ambulatory Visit (HOSPITAL_BASED_OUTPATIENT_CLINIC_OR_DEPARTMENT_OTHER): Payer: Self-pay

## 2022-11-16 ENCOUNTER — Other Ambulatory Visit: Payer: Self-pay | Admitting: Family Medicine

## 2022-11-16 MED ORDER — ATENOLOL 25 MG PO TABS
12.5000 mg | ORAL_TABLET | Freq: Two times a day (BID) | ORAL | 0 refills | Status: DC
  Filled 2022-11-16: qty 30, 30d supply, fill #0

## 2022-12-16 ENCOUNTER — Other Ambulatory Visit: Payer: Self-pay | Admitting: Medical Genetics

## 2022-12-16 DIAGNOSIS — Z006 Encounter for examination for normal comparison and control in clinical research program: Secondary | ICD-10-CM

## 2023-01-25 ENCOUNTER — Telehealth: Payer: Self-pay | Admitting: Family Medicine

## 2023-01-26 ENCOUNTER — Other Ambulatory Visit (HOSPITAL_BASED_OUTPATIENT_CLINIC_OR_DEPARTMENT_OTHER): Payer: Self-pay

## 2023-01-26 MED ORDER — ATENOLOL 25 MG PO TABS
12.5000 mg | ORAL_TABLET | Freq: Two times a day (BID) | ORAL | 0 refills | Status: DC
  Filled 2023-01-26: qty 30, 30d supply, fill #0

## 2023-01-26 NOTE — Telephone Encounter (Signed)
Last OV--10/05/2021 Needs appointment to refill this medication. No upcoming future appointment is scheduled. Will call the patient to schedule appointment.

## 2023-01-26 NOTE — Telephone Encounter (Signed)
Called left message to call back 

## 2023-01-26 NOTE — Telephone Encounter (Signed)
Pt called back. I relayed your message and scheduled her on OV for this Friday at 8:30 with pcp.

## 2023-01-26 NOTE — Telephone Encounter (Signed)
Patient scheduled appt for this week

## 2023-01-28 ENCOUNTER — Other Ambulatory Visit (HOSPITAL_BASED_OUTPATIENT_CLINIC_OR_DEPARTMENT_OTHER): Payer: Self-pay

## 2023-01-28 ENCOUNTER — Other Ambulatory Visit: Payer: Self-pay

## 2023-01-28 ENCOUNTER — Ambulatory Visit: Payer: BC Managed Care – PPO | Admitting: Family Medicine

## 2023-01-28 ENCOUNTER — Encounter: Payer: Self-pay | Admitting: Family Medicine

## 2023-01-28 VITALS — BP 121/71 | HR 72 | Temp 97.8°F | Ht 60.0 in | Wt 117.1 lb

## 2023-01-28 DIAGNOSIS — R000 Tachycardia, unspecified: Secondary | ICD-10-CM

## 2023-01-28 DIAGNOSIS — R002 Palpitations: Secondary | ICD-10-CM

## 2023-01-28 MED ORDER — ATENOLOL 25 MG PO TABS
12.5000 mg | ORAL_TABLET | Freq: Two times a day (BID) | ORAL | 3 refills | Status: DC
  Filled 2023-01-28: qty 90, 90d supply, fill #0
  Filled 2023-07-26: qty 90, 90d supply, fill #1

## 2023-01-28 NOTE — Progress Notes (Signed)
Chief Complaint  Patient presents with   Follow-up    Subjective: Patient is a 22 y.o. female here for f/u.  Pt has hx of tachycardia/palpitations. She takes atenolol 12.5 mg bid. Reports compliance, no AE's. Controls her s/s's well.  Past Medical History:  Diagnosis Date   Neurofibromatosis ([facility])    NF1   Scoliosis     Objective: BP 121/71 (BP Location: Right Arm, Patient Position: Sitting, Cuff Size: Normal)   Pulse 72   Temp 97.8 F (36.6 C) (Oral)   Ht 5' (1.524 m)   Wt 117 lb 2 oz (53.1 kg)   LMP 09/27/2017   SpO2 96%   BMI 22.87 kg/m  General: Awake, appears stated age Heart: RRR Lungs: CTAB, no rales, wheezes or rhonchi. No accessory muscle use Psych: Age appropriate judgment and insight, normal affect and mood  Assessment and Plan: Sinus tachycardia  Palpitations - Plan: atenolol (TENORMIN) 25 MG tablet  Chronic, stable. Cont atenolol 12.5 mg bid. F/u in 6 mo for CPE or prn.  The patient voiced understanding and agreement to the plan.  Jilda Roche [place], DO 01/28/23  8:52 AM

## 2023-01-28 NOTE — Patient Instructions (Addendum)
Keep the diet clean and stay active.  Please consider adding some weight resistance exercise to your routine. Consider yoga as well.   Call Center for [facility] at [facility] at [phone] for an appointment.  They are located at [address], [address], [place], [place], [address] (right across the hall from our office).  Let us know if you need anything.

## 2023-04-15 ENCOUNTER — Other Ambulatory Visit (HOSPITAL_BASED_OUTPATIENT_CLINIC_OR_DEPARTMENT_OTHER): Payer: Self-pay

## 2023-04-15 DIAGNOSIS — Q8501 Neurofibromatosis, type 1: Secondary | ICD-10-CM | POA: Diagnosis not present

## 2023-04-15 MED ORDER — MELOXICAM 7.5 MG PO TABS
7.5000 mg | ORAL_TABLET | Freq: Every day | ORAL | 1 refills | Status: DC
  Filled 2023-04-15: qty 30, 30d supply, fill #0

## 2023-07-26 ENCOUNTER — Other Ambulatory Visit (HOSPITAL_BASED_OUTPATIENT_CLINIC_OR_DEPARTMENT_OTHER): Payer: Self-pay

## 2023-07-28 ENCOUNTER — Other Ambulatory Visit (HOSPITAL_BASED_OUTPATIENT_CLINIC_OR_DEPARTMENT_OTHER): Payer: Self-pay

## 2023-08-01 ENCOUNTER — Encounter: Payer: Self-pay | Admitting: Family Medicine

## 2023-08-01 ENCOUNTER — Ambulatory Visit (INDEPENDENT_AMBULATORY_CARE_PROVIDER_SITE_OTHER): Admitting: Family Medicine

## 2023-08-01 ENCOUNTER — Encounter: Payer: BC Managed Care – PPO | Admitting: Family Medicine

## 2023-08-01 VITALS — BP 122/60 | HR 93 | Temp 98.0°F | Resp 18 | Ht 60.0 in | Wt 115.6 lb

## 2023-08-01 DIAGNOSIS — Z0000 Encounter for general adult medical examination without abnormal findings: Secondary | ICD-10-CM

## 2023-08-01 DIAGNOSIS — Z23 Encounter for immunization: Secondary | ICD-10-CM

## 2023-08-01 NOTE — Patient Instructions (Addendum)
 Give us  2-3 business days to get the results of your labs back.   Keep the diet clean and stay active.  Please schedule your GYN appointment.   Please get me a copy of your advanced directive form at your convenience.   Foods that may reduce pain: 1) Ginger 2) Blueberries 3) Salmon 4) Pumpkin seeds 5) Dark chocolate 6) Turmeric 7) Tart cherries 8) Virgin olive oil 9) Chili peppers 10) Mint 11) Krill oil  Let us  know if you need anything.

## 2023-08-01 NOTE — Progress Notes (Signed)
 Chief Complaint  Patient presents with   Annual Exam    Concerns/ questions: none Pap: does not see GYN yet     Well Woman Toni Caldwell is here for a complete physical.   Her last physical was >1 year ago.  Current diet: in general, a "healthy" diet. Current exercise: some weights, jogging/walking Fatigue out of ordinary? No Seatbelt? Yes Advanced directive? No  Health Maintenance Pap/HPV- No Tetanus- Yes HIV screening- Yes Hep C screening- Yes  Past Medical History:  Diagnosis Date   Neurofibromatosis ([facility])    NF1   Scoliosis      Past Surgical History:  Procedure Laterality Date   BACK SURGERY  2014   for scoliosis    Medications  Current Outpatient Medications on File Prior to Visit  Medication Sig Dispense Refill   atenolol (TENORMIN) 25 MG tablet Take 0.5 tablets (12.5 mg total) by mouth 2 (two) times daily. 90 tablet 3    Allergies Allergies  Allergen Reactions   Codeine Other (See Comments)    Never had it, but several family members are allergic and prefers to not take it    Review of Systems: Constitutional:  no unexpected weight changes Eye:  no recent significant change in vision Ear/Nose/Mouth/Throat:  Ears:  no tinnitus or vertigo and no recent change in hearing Nose/Mouth/Throat:  no complaints of nasal congestion, no sore throat Cardiovascular: no chest pain Respiratory:  no cough and no shortness of breath Gastrointestinal:  no abdominal pain, no change in bowel habits GU:  Female: negative for dysuria or pelvic pain Musculoskeletal/Extremities:  no pain of the joints Integumentary (Skin/Breast):  no abnormal skin lesions reported Neurologic:  no headaches Endocrine:  denies fatigue Hematologic/Lymphatic:  No areas of easy bleeding  Exam BP 122/60 (BP Location: Left Arm, Patient Position: Sitting, Cuff Size: Normal)   Pulse 93   Temp 98 F (36.7 C) (Temporal)   Resp 18   Ht 5' (1.524 m)   Wt 115 lb 9.6 oz (52.4 kg)   LMP  09/27/2017   SpO2 97%   BMI 22.58 kg/m  General:  well developed, well nourished, in no apparent distress Skin:  no significant moles, warts, or growths Head:  no masses, lesions, or tenderness Eyes:  pupils equal and round, sclera anicteric without injection Ears:  canals without lesions, TMs shiny without retraction, no obvious effusion, no erythema Nose:  nares patent, mucosa normal, and no drainage  Throat/Pharynx:  lips and gingiva without lesion; tongue and uvula midline; non-inflamed pharynx; no exudates or postnasal drainage Neck: neck supple without adenopathy, thyromegaly, or masses Lungs:  clear to auscultation, breath sounds equal bilaterally, no respiratory distress Cardio:  regular rate and rhythm, no bruits, no LE edema Abdomen:  abdomen soft, nontender; bowel sounds normal; no masses or organomegaly Genital: Defer to GYN Musculoskeletal:  symmetrical muscle groups noted without atrophy or deformity Extremities:  no clubbing, cyanosis, or edema, no deformities, no skin discoloration Neuro:  gait normal; deep tendon reflexes normal and symmetric Psych: well oriented with normal range of affect and appropriate judgment/insight  Assessment and Plan  Well adult exam - Plan: CBC, Comprehensive metabolic panel with GFR, Lipid panel, Pneumococcal conjugate vaccine 20-valent (Prevnar 20)  Immunization due - Plan: Pneumococcal conjugate vaccine 20-valent (Prevnar 20)   Well 23 y.o. female. Counseled on diet and exercise. Advanced directive form provided today.  PCV20 today, hx of MVP.  GYN appt recommended, she promises to schedule this year.  Other orders as above.  Follow up in 6 mo. The patient voiced understanding and agreement to the plan.  Shellie Dials George, DO 08/01/23 3:48 PM

## 2023-08-02 ENCOUNTER — Encounter: Payer: Self-pay | Admitting: Family Medicine

## 2023-08-02 LAB — COMPREHENSIVE METABOLIC PANEL WITH GFR
ALT: 30 U/L (ref 0–35)
AST: 20 U/L (ref 0–37)
Albumin: 4.6 g/dL (ref 3.5–5.2)
Alkaline Phosphatase: 67 U/L (ref 39–117)
BUN: 12 mg/dL (ref 6–23)
CO2: 24 meq/L (ref 19–32)
Calcium: 9.4 mg/dL (ref 8.4–10.5)
Chloride: 109 meq/L (ref 96–112)
Creatinine, Ser: 0.75 mg/dL (ref 0.40–1.20)
GFR: 110.83 mL/min (ref >60.00)
Glucose, Bld: 91 mg/dL (ref 70–99)
Potassium: 4.2 meq/L (ref 3.5–5.1)
Sodium: 140 meq/L (ref 135–145)
Total Bilirubin: 0.5 mg/dL (ref 0.2–1.2)
Total Protein: 7 g/dL (ref 6.0–8.3)

## 2023-08-02 LAB — LIPID PANEL
Cholesterol: 143 mg/dL (ref 0–200)
HDL: 55.3 mg/dL (ref >39.00)
LDL Cholesterol: 77 mg/dL (ref 0–99)
NonHDL: 87.77
Total CHOL/HDL Ratio: 3
Triglycerides: 56 mg/dL (ref 0.0–149.0)
VLDL: 11.2 mg/dL (ref 0.0–40.0)

## 2023-08-02 LAB — CBC
HCT: 43.3 % (ref 36.0–46.0)
Hemoglobin: 14.5 g/dL (ref 12.0–15.0)
MCHC: 33.4 g/dL (ref 30.0–36.0)
MCV: 90.4 fl (ref 78.0–100.0)
Platelets: 259 10*3/uL (ref 150.0–400.0)
RBC: 4.8 Mil/uL (ref 3.87–5.11)
RDW: 11.9 % (ref 11.5–15.5)
WBC: 9.6 10*3/uL (ref 4.0–10.5)

## 2024-01-31 ENCOUNTER — Ambulatory Visit: Admitting: Family Medicine

## 2024-02-03 ENCOUNTER — Other Ambulatory Visit: Payer: Self-pay | Admitting: Medical Genetics

## 2024-02-03 DIAGNOSIS — Z006 Encounter for examination for normal comparison and control in clinical research program: Secondary | ICD-10-CM

## 2024-02-12 ENCOUNTER — Other Ambulatory Visit: Payer: Self-pay | Admitting: Family Medicine

## 2024-02-12 DIAGNOSIS — R002 Palpitations: Secondary | ICD-10-CM

## 2024-04-06 DIAGNOSIS — Q8501 Neurofibromatosis, type 1: Secondary | ICD-10-CM | POA: Diagnosis not present
